# Patient Record
Sex: Male | Born: 1950 | Race: White | Hispanic: No | Marital: Married | Smoking: Current every day smoker
Health system: Southern US, Community
[De-identification: ages and names within clinical notes are randomized; demographics above are authoritative.]

## PROBLEM LIST (undated history)

## (undated) DIAGNOSIS — R079 Chest pain, unspecified: Secondary | ICD-10-CM

## (undated) HISTORY — DX: Chest pain, unspecified: R07.9

## (undated) HISTORY — PX: WRIST SURGERY: SHX841

## (undated) HISTORY — PX: CERVICAL SPINE SURGERY: SHX589

## (undated) HISTORY — PX: KNEE SURGERY: SHX244

---

## 2018-03-14 ENCOUNTER — Emergency Department (HOSPITAL_COMMUNITY): Payer: Medicare Other

## 2018-03-14 ENCOUNTER — Other Ambulatory Visit: Payer: Self-pay

## 2018-03-14 ENCOUNTER — Encounter (HOSPITAL_COMMUNITY): Payer: Self-pay | Admitting: Emergency Medicine

## 2018-03-14 DIAGNOSIS — R079 Chest pain, unspecified: Secondary | ICD-10-CM | POA: Diagnosis present

## 2018-03-14 DIAGNOSIS — F17210 Nicotine dependence, cigarettes, uncomplicated: Secondary | ICD-10-CM | POA: Diagnosis not present

## 2018-03-14 DIAGNOSIS — R072 Precordial pain: Secondary | ICD-10-CM | POA: Diagnosis not present

## 2018-03-14 LAB — CBC
HEMATOCRIT: 46.6 % (ref 39.0–52.0)
Hemoglobin: 16 g/dL (ref 13.0–17.0)
MCH: 29.1 pg (ref 26.0–34.0)
MCHC: 34.3 g/dL (ref 30.0–36.0)
MCV: 84.9 fL (ref 78.0–100.0)
PLATELETS: 238 10*3/uL (ref 150–400)
RBC: 5.49 MIL/uL (ref 4.22–5.81)
RDW: 13 % (ref 11.5–15.5)
WBC: 14.2 10*3/uL — AB (ref 4.0–10.5)

## 2018-03-14 LAB — BASIC METABOLIC PANEL
Anion gap: 12 (ref 5–15)
BUN: 12 mg/dL (ref 6–20)
CHLORIDE: 100 mmol/L — AB (ref 101–111)
CO2: 25 mmol/L (ref 22–32)
Calcium: 9.5 mg/dL (ref 8.9–10.3)
Creatinine, Ser: 1.11 mg/dL (ref 0.61–1.24)
GFR calc non Af Amer: >60 mL/min (ref >60)
Glucose, Bld: 84 mg/dL (ref 65–99)
POTASSIUM: 4.2 mmol/L (ref 3.5–5.1)
SODIUM: 137 mmol/L (ref 135–145)

## 2018-03-14 LAB — I-STAT TROPONIN, ED: Troponin i, poc: 0 ng/mL (ref 0.00–0.08)

## 2018-03-14 NOTE — ED Notes (Signed)
Updated on wait for treatment room.  Remains in ED waiting room.

## 2018-03-14 NOTE — ED Triage Notes (Signed)
C/o intermittent dullness to L chest with sob x 5 days.  Denies nausea and vomiting.  No pain at present.

## 2018-03-15 ENCOUNTER — Emergency Department (HOSPITAL_COMMUNITY)
Admission: EM | Admit: 2018-03-15 | Discharge: 2018-03-15 | Disposition: A | Discharge status: Home / Self Care | Payer: Medicare Other | Attending: Emergency Medicine | Admitting: Emergency Medicine

## 2018-03-15 ENCOUNTER — Encounter (HOSPITAL_COMMUNITY): Payer: Self-pay | Admitting: Emergency Medicine

## 2018-03-15 DIAGNOSIS — R072 Precordial pain: Secondary | ICD-10-CM

## 2018-03-15 LAB — I-STAT TROPONIN, ED: Troponin i, poc: 0 ng/mL (ref 0.00–0.08)

## 2018-03-15 MED ORDER — GI COCKTAIL ~~LOC~~
30.0000 mL | Freq: Once | ORAL | Status: AC
  Administered 2018-03-15: 30 mL via ORAL
  Filled 2018-03-15: qty 30

## 2018-03-15 NOTE — ED Provider Notes (Signed)
[facility] EMERGENCY DEPARTMENT Provider Note   CSN: [id number] Arrival date & time: 03/14/18  1655     History   Chief Complaint Chief Complaint  Patient presents with  . Chest Pain    HPI Andrew Tapia is a 67 y.o. male.  The history is provided by the patient. No language interpreter was used.  Chest Pain   This is a new problem. The current episode started more than 2 days ago. The problem occurs daily. The problem has not changed since onset.The pain is associated with rest (No DOE or exertional symptoms). The pain is present in the substernal region. The pain is mild. The quality of the pain is described as brief (skipping sensation). The pain does not radiate. Exacerbated by: no exertional symptoms, he gets better with activity. Pertinent negatives include no abdominal pain, no back pain, no claudication, no cough, no diaphoresis, no dizziness, no exertional chest pressure, no fever, no headaches, no hemoptysis, no irregular heartbeat, no leg pain, no lower extremity edema, no malaise/fatigue, no nausea, no near-syncope, no numbness, no orthopnea, no palpitations, no PND, no shortness of breath, no sputum production, no syncope, no vomiting and no weakness. He has tried rest for the symptoms. The treatment provided no relief. Risk factors include male gender.  Pertinent negatives for past medical history include no MI and no PE.  Pertinent negatives for family medical history include: no Marfan's syndrome.  Procedure history is negative for cardiac catheterization.    History reviewed. No pertinent past medical history.  There are no active problems to display for this patient.   Past Surgical History:  Procedure Laterality Date  . CERVICAL SPINE SURGERY    . KNEE SURGERY    . WRIST SURGERY          Home Medications    Prior to Admission medications   Not on File    Family History No family history on file.  Social History Social History    Tobacco Use  . Smoking status: Current Every Day Smoker  . Smokeless tobacco: Never Used  Substance Use Topics  . Alcohol use: Not Currently  . Drug use: Never     Allergies   Patient has no allergy information on record.   Review of Systems Review of Systems  Constitutional: Negative for diaphoresis, fever and malaise/fatigue.  Respiratory: Negative for cough, hemoptysis, sputum production and shortness of breath.   Cardiovascular: Positive for chest pain. Negative for palpitations, orthopnea, claudication, leg swelling, syncope, PND and near-syncope.  Gastrointestinal: Negative for abdominal pain, nausea and vomiting.  Musculoskeletal: Negative for back pain.  Neurological: Negative for dizziness, weakness, numbness and headaches.  All other systems reviewed and are negative.    Physical Exam Updated Vital Signs BP (!) 148/100 (BP Location: Left Arm)   Pulse 77   Temp 98.6 F (37 C) (Oral)   Resp 16   Ht 6' (1.829 m)   Wt 117.9 kg (260 lb)   SpO2 100%   BMI 35.26 kg/m   Physical Exam  Constitutional: He is oriented to person, place, and time. He appears well-developed and well-nourished. No distress.  HENT:  Head: Normocephalic and atraumatic.  Mouth/Throat: No oropharyngeal exudate.  Eyes: Pupils are equal, round, and reactive to light. Conjunctivae are normal.  Neck: Normal range of motion. Neck supple.  Cardiovascular: Normal rate, regular rhythm, normal heart sounds and intact distal pulses.  Pulmonary/Chest: Effort normal and breath sounds normal. No stridor. He has no wheezes.  He has no rales.  Abdominal: Soft. Bowel sounds are normal. He exhibits no mass. There is no tenderness. There is no rebound and no guarding.  Musculoskeletal: Normal range of motion.  Neurological: He is alert and oriented to person, place, and time. He displays normal reflexes.  Skin: Skin is warm and dry. Capillary refill takes less than 2 seconds.  Psychiatric: He has a normal  mood and affect.     ED Treatments / Results  Labs (all labs ordered are listed, but only abnormal results are displayed)  Results for orders placed or performed during the hospital encounter of 03/15/18  Basic metabolic panel  Result Value Ref Range   Sodium 137 135 - 145 mmol/L   Potassium 4.2 3.5 - 5.1 mmol/L   Chloride 100 (L) 101 - 111 mmol/L   CO2 25 22 - 32 mmol/L   Glucose, Bld 84 65 - 99 mg/dL   BUN 12 6 - 20 mg/dL   Creatinine, Ser 4.541.11 0.61 - 1.24 mg/dL   Calcium 9.5 8.9 - 09.810.3 mg/dL   GFR calc non Af Amer >60 >60 mL/min   GFR calc Af Amer >60 >60 mL/min   Anion gap 12 5 - 15  CBC  Result Value Ref Range   WBC 14.2 (H) 4.0 - 10.5 K/uL   RBC 5.49 4.22 - 5.81 MIL/uL   Hemoglobin 16.0 13.0 - 17.0 g/dL   HCT 11.946.6 14.739.0 - 82.952.0 %   MCV 84.9 78.0 - 100.0 fL   MCH 29.1 26.0 - 34.0 pg   MCHC 34.3 30.0 - 36.0 g/dL   RDW 56.213.0 13.011.5 - 86.515.5 %   Platelets 238 150 - 400 K/uL  I-stat troponin, ED  Result Value Ref Range   Troponin i, poc 0.00 0.00 - 0.08 ng/mL   Comment 3          I-stat troponin, ED  Result Value Ref Range   Troponin i, poc 0.00 0.00 - 0.08 ng/mL   Comment 3           Dg Chest 2 View  Result Date: 03/14/2018 CLINICAL DATA:  Chest pain EXAM: CHEST - 2 VIEW COMPARISON:  None. FINDINGS: The heart size and mediastinal contours are within normal limits. Both lungs are clear. Mild degenerative changes of the spine. IMPRESSION: No active cardiopulmonary disease. Electronically Signed   By: Jasmine PangKim  Fujinaga M.D.   On: 03/14/2018 18:30   ' EKG EKG Interpretation  Date/Time:  Wednesday March 14 2018 17:04:42 EDT Ventricular Rate:  84 PR Interval:  160 QRS Duration: 84 QT Interval:  356 QTC Calculation: 420 R Axis:   80 Text Interpretation:  Normal sinus rhythm Confirmed by Nicanor AlconPalumbo, Jaray Boliver ([id number]) on 03/15/2018 12:55:34 AM   Radiology Dg Chest 2 View  Result Date: 03/14/2018 CLINICAL DATA:  Chest pain EXAM: CHEST - 2 VIEW COMPARISON:  None. FINDINGS: The  heart size and mediastinal contours are within normal limits. Both lungs are clear. Mild degenerative changes of the spine. IMPRESSION: No active cardiopulmonary disease. Electronically Signed   By: Jasmine PangKim  Fujinaga M.D.   On: 03/14/2018 18:30    Procedures Procedures (including critical care time)  Medications Ordered in ED Medications  gi cocktail (Maalox,Lidocaine,Donnatal) (30 mLs Oral Given 03/15/18 0202)     Final Clinical Impressions(s) / ED Diagnoses   No exertional symptoms.  Ruled out for MI.  No leg pain or swelling no surgeries or car trips/plane trips.  Will refer to cardiology for holter monitor and follow up.  Heart score is 1 low risk for MACE.  Return for weakness, numbness, changes in vision or speech, fevers >100.4 unrelieved by medication, shortness of breath, intractable vomiting, or diarrhea, abdominal pain, Inability to tolerate liquids or food, cough, altered mental status or any concerns. No signs of systemic illness or infection. The patient is nontoxic-appearing on exam and vital signs are within normal limits.   I have reviewed the triage vital signs and the nursing notes. Pertinent labs &imaging results that were available during my care of the patient were reviewed by me and considered in my medical decision making (see chart for details).  After history, exam, and medical workup I feel the patient has been appropriately medically screened and is safe for discharge home. Pertinent diagnoses were discussed with the patient. Patient was given return precautions.  ED Discharge Orders    None       Ashaki Frosch, MD 03/15/18 1610

## 2018-03-30 ENCOUNTER — Encounter: Payer: Self-pay | Admitting: Cardiology

## 2018-04-09 ENCOUNTER — Ambulatory Visit: Payer: Medicare Other | Admitting: Cardiology

## 2018-04-10 ENCOUNTER — Encounter: Payer: Self-pay | Admitting: Cardiology

## 2018-04-10 ENCOUNTER — Ambulatory Visit: Payer: Medicare Other | Admitting: Cardiology

## 2018-04-10 VITALS — BP 116/90 | HR 65 | Ht 72.0 in | Wt 257.0 lb

## 2018-04-10 DIAGNOSIS — Z720 Tobacco use: Secondary | ICD-10-CM | POA: Diagnosis not present

## 2018-04-10 DIAGNOSIS — R072 Precordial pain: Secondary | ICD-10-CM

## 2018-04-10 DIAGNOSIS — R0602 Shortness of breath: Secondary | ICD-10-CM | POA: Diagnosis not present

## 2018-04-10 DIAGNOSIS — E785 Hyperlipidemia, unspecified: Secondary | ICD-10-CM | POA: Diagnosis not present

## 2018-04-10 DIAGNOSIS — R079 Chest pain, unspecified: Secondary | ICD-10-CM | POA: Insufficient documentation

## 2018-04-10 DIAGNOSIS — Z8249 Family history of ischemic heart disease and other diseases of the circulatory system: Secondary | ICD-10-CM | POA: Diagnosis not present

## 2018-04-10 MED ORDER — PREDNISONE 50 MG PO TABS: 50.0000 mg | ORAL_TABLET | Freq: Every day | ORAL | 0 refills | Status: DC

## 2018-04-10 MED ORDER — METOPROLOL TARTRATE 50 MG PO TABS: 50.0000 mg | ORAL_TABLET | Freq: Once | ORAL | 0 refills | Status: DC

## 2018-04-10 MED ORDER — DIPHENHYDRAMINE HCL 50 MG PO TABS: 50.0000 mg | ORAL_TABLET | Freq: Once | ORAL | 0 refills | Status: DC

## 2018-04-10 NOTE — Patient Instructions (Addendum)
Medication Instructions: No mediations were started today  Labwork: FUTURE: BMET for CT   Procedures/Testing: Please schedule Coronary CTA, see instructions below   Follow-Up: Your physician recommends that you schedule a follow-up appointment in: See Lizabeth LeydenNina Hammond NP 1 week after CT   Any Additional Special Instructions Will Be Listed Below (If Applicable).   DASH Eating Plan DASH stands for "Dietary Approaches to Stop Hypertension." The DASH eating plan is a healthy eating plan that has been shown to reduce high blood pressure (hypertension). It may also reduce your risk for type 2 diabetes, heart disease, and stroke. The DASH eating plan may also help with weight loss. What are tips for following this plan? General guidelines  Avoid eating more than 2,300 mg (milligrams) of salt (sodium) a day. If you have hypertension, you may need to reduce your sodium intake to 1,500 mg a day.  Limit alcohol intake to no more than 1 drink a day for nonpregnant women and 2 drinks a day for men. One drink equals 12 oz of beer, 5 oz of wine, or 1 oz of hard liquor.  Work with your health care provider to maintain a healthy body weight or to lose weight. Ask what an ideal weight is for you.  Get at least 30 minutes of exercise that causes your heart to beat faster (aerobic exercise) most days of the week. Activities may include walking, swimming, or biking.  Work with your health care provider or diet and nutrition specialist (dietitian) to adjust your eating plan to your individual calorie needs. Reading food labels  Check food labels for the amount of sodium per serving. Choose foods with less than 5 percent of the Daily Value of sodium. Generally, foods with less than 300 mg of sodium per serving fit into this eating plan.  To find whole grains, look for the word "whole" as the first word in the ingredient list. Shopping  Buy products labeled as "low-sodium" or "no salt added."  Buy fresh  foods. Avoid canned foods and premade or frozen meals. Cooking  Avoid adding salt when cooking. Use salt-free seasonings or herbs instead of table salt or sea salt. Check with your health care provider or pharmacist before using salt substitutes.  Do not fry foods. Cook foods using healthy methods such as baking, boiling, grilling, and broiling instead.  Cook with heart-healthy oils, such as olive, canola, soybean, or sunflower oil. Meal planning   Eat a balanced diet that includes: ? 5 or more servings of fruits and vegetables each day. At each meal, try to fill half of your plate with fruits and vegetables. ? Up to 6-8 servings of whole grains each day. ? Less than 6 oz of lean meat, poultry, or fish each day. A 3-oz serving of meat is about the same size as a deck of cards. One egg equals 1 oz. ? 2 servings of low-fat dairy each day. ? A serving of nuts, seeds, or beans 5 times each week. ? Heart-healthy fats. Healthy fats called Omega-3 fatty acids are found in foods such as flaxseeds and coldwater fish, like sardines, salmon, and mackerel.  Limit how much you eat of the following: ? Canned or prepackaged foods. ? Food that is high in trans fat, such as fried foods. ? Food that is high in saturated fat, such as fatty meat. ? Sweets, desserts, sugary drinks, and other foods with added sugar. ? Full-fat dairy products.  Do not salt foods before eating.  Try to eat  at least 2 vegetarian meals each week.  Eat more home-cooked food and less restaurant, buffet, and fast food.  When eating at a restaurant, ask that your food be prepared with less salt or no salt, if possible. What foods are recommended? The items listed may not be a complete list. Talk with your dietitian about what dietary choices are best for you. Grains Whole-grain or whole-wheat bread. Whole-grain or whole-wheat pasta. Brown rice. Orpah Cobbatmeal. Quinoa. Bulgur. Whole-grain and low-sodium cereals. Pita bread. Low-fat,  low-sodium crackers. Whole-wheat flour tortillas. Vegetables Fresh or frozen vegetables (raw, steamed, roasted, or grilled). Low-sodium or reduced-sodium tomato and vegetable juice. Low-sodium or reduced-sodium tomato sauce and tomato paste. Low-sodium or reduced-sodium canned vegetables. Fruits All fresh, dried, or frozen fruit. Canned fruit in natural juice (without added sugar). Meat and other protein foods Skinless chicken or Malawiturkey. Ground chicken or Malawiturkey. Pork with fat trimmed off. Fish and seafood. Egg whites. Dried beans, peas, or lentils. Unsalted nuts, nut butters, and seeds. Unsalted canned beans. Lean cuts of beef with fat trimmed off. Low-sodium, lean deli meat. Dairy Low-fat (1%) or fat-free (skim) milk. Fat-free, low-fat, or reduced-fat cheeses. Nonfat, low-sodium ricotta or cottage cheese. Low-fat or nonfat yogurt. Low-fat, low-sodium cheese. Fats and oils Soft margarine without trans fats. Vegetable oil. Low-fat, reduced-fat, or light mayonnaise and salad dressings (reduced-sodium). Canola, safflower, olive, soybean, and sunflower oils. Avocado. Seasoning and other foods Herbs. Spices. Seasoning mixes without salt. Unsalted popcorn and pretzels. Fat-free sweets. What foods are not recommended? The items listed may not be a complete list. Talk with your dietitian about what dietary choices are best for you. Grains Baked goods made with fat, such as croissants, muffins, or some breads. Dry pasta or rice meal packs. Vegetables Creamed or fried vegetables. Vegetables in a cheese sauce. Regular canned vegetables (not low-sodium or reduced-sodium). Regular canned tomato sauce and paste (not low-sodium or reduced-sodium). Regular tomato and vegetable juice (not low-sodium or reduced-sodium). Rosita FirePickles. Olives. Fruits Canned fruit in a light or heavy syrup. Fried fruit. Fruit in cream or butter sauce. Meat and other protein foods Fatty cuts of meat. Ribs. Fried meat. Tomasa BlaseBacon. Sausage.  Bologna and other processed lunch meats. Salami. Fatback. Hotdogs. Bratwurst. Salted nuts and seeds. Canned beans with added salt. Canned or smoked fish. Whole eggs or egg yolks. Chicken or Malawiturkey with skin. Dairy Whole or 2% milk, cream, and half-and-half. Whole or full-fat cream cheese. Whole-fat or sweetened yogurt. Full-fat cheese. Nondairy creamers. Whipped toppings. Processed cheese and cheese spreads. Fats and oils Butter. Stick margarine. Lard. Shortening. Ghee. Bacon fat. Tropical oils, such as coconut, palm kernel, or palm oil. Seasoning and other foods Salted popcorn and pretzels. Onion salt, garlic salt, seasoned salt, table salt, and sea salt. Worcestershire sauce. Tartar sauce. Barbecue sauce. Teriyaki sauce. Soy sauce, including reduced-sodium. Steak sauce. Canned and packaged gravies. Fish sauce. Oyster sauce. Cocktail sauce. Horseradish that you find on the shelf. Ketchup. Mustard. Meat flavorings and tenderizers. Bouillon cubes. Hot sauce and Tabasco sauce. Premade or packaged marinades. Premade or packaged taco seasonings. Relishes. Regular salad dressings. Where to find more information:  National Heart, Lung, and Blood Institute: PopSteam.iswww.nhlbi.nih.gov  American Heart Association: www.heart.org Summary  The DASH eating plan is a healthy eating plan that has been shown to reduce high blood pressure (hypertension). It may also reduce your risk for type 2 diabetes, heart disease, and stroke.  With the DASH eating plan, you should limit salt (sodium) intake to 2,300 mg a day. If you have hypertension,  you may need to reduce your sodium intake to 1,500 mg a day.  When on the DASH eating plan, aim to eat more fresh fruits and vegetables, whole grains, lean proteins, low-fat dairy, and heart-healthy fats.  Work with your health care provider or diet and nutrition specialist (dietitian) to adjust your eating plan to your individual calorie needs. This information is not intended to  replace advice given to you by your health care provider. Make sure you discuss any questions you have with your health care provider. Document Released: 11/24/2011 Document Revised: 11/28/2016 Document Reviewed: 11/28/2016 Elsevier Interactive Patient Education  [employer].        If you need a refill on your cardiac medications before your next appointment, please call your pharmacy.     Please arrive at the [facility] main entrance of [facility] at xx:xx AM (30-45 minutes prior to test start time)  [facility] [address] [place], [place] [address] [phone]  Proceed to the [facility] Radiology Department (First Floor).  Please follow these instructions carefully (unless otherwise directed):  Hold all erectile dysfunction medications at least 48 hours prior to test.  On the Night Before the Test: . Drink plenty of water. . Do not consume any caffeinated/decaffeinated beverages or chocolate 12 hours prior to your test. . Do not take any antihistamines 12 hours prior to your test. . If you take Metformin do not take 24 hours prior to test. . If the patient has contrast allergy: ? Patient will need a prescription for Prednisone and very clear instructions (as follows): 1. Prednisone 50 mg - take 13 hours prior to test 2. Take another Prednisone 50 mg 7 hours prior to test 3. Take another Prednisone 50 mg 1 hour prior to test 4. Take Benadryl 50 mg 1 hour prior to test . Patient must complete all four doses of above prophylactic medications. . Patient will need a ride after test due to Benadryl.  On the Day of the Test: . Drink plenty of water. Do not drink any water within one hour of the test. . Do not eat any food 4 hours prior to the test. . You may take your regular medications prior to the test. . IF NOT ON A BETA BLOCKER - Take 50 mg of lopressor (metoprolol) one hour before the test. . HOLD Furosemide morning of the  test.  After the Test: . Drink plenty of water. . After receiving IV contrast, you may experience a mild flushed feeling. This is normal. . On occasion, you may experience a mild rash up to 24 hours after the test. This is not dangerous. If this occurs, you can take Benadryl 25 mg and increase your fluid intake. . If you experience trouble breathing, this can be serious. If it is severe call 911 IMMEDIATELY. If it is mild, please call our office. . If you take any of these medications: Glipizide/Metformin, Avandament, Glucavance, please do not take 48 hours after completing test.  Steps to Quit Smoking Smoking tobacco can be bad for your health. It can also affect almost every organ in your body. Smoking puts you and people around you at risk for many serious long-lasting (chronic) diseases. Quitting smoking is hard, but it is one of the best things that you can do for your health. It is never too late to quit. What are the benefits of quitting smoking? When you quit smoking, you lower your risk for getting serious diseases and  conditions. They can include:  Lung cancer or lung disease.  Heart disease.  Stroke.  Heart attack.  Not being able to have children (infertility).  Weak bones (osteoporosis) and broken bones (fractures).  If you have coughing, wheezing, and shortness of breath, those symptoms may get better when you quit. You may also get sick less often. If you are pregnant, quitting smoking can help to lower your chances of having a baby of low birth weight. What can I do to help me quit smoking? Talk with your doctor about what can help you quit smoking. Some things you can do (strategies) include:  Quitting smoking totally, instead of slowly cutting back how much you smoke over a period of time.  Going to in-person counseling. You are more likely to quit if you go to many counseling sessions.  Using resources and support systems, such as: ? Agricultural engineer with a  Veterinary surgeon. ? Phone quitlines. ? Automotive engineer. ? Support groups or group counseling. ? Text messaging programs. ? Mobile phone apps or applications.  Taking medicines. Some of these medicines may have nicotine in them. If you are pregnant or breastfeeding, do not take any medicines to quit smoking unless your doctor says it is okay. Talk with your doctor about counseling or other things that can help you.  Talk with your doctor about using more than one strategy at the same time, such as taking medicines while you are also going to in-person counseling. This can help make quitting easier. What things can I do to make it easier to quit? Quitting smoking might feel very hard at first, but there is a lot that you can do to make it easier. Take these steps:  Talk to your family and friends. Ask them to support and encourage you.  Call phone quitlines, reach out to support groups, or work with a Veterinary surgeon.  Ask people who smoke to not smoke around you.  Avoid places that make you want (trigger) to smoke, such as: ? Bars. ? Parties. ? Smoke-break areas at work.  Spend time with people who do not smoke.  Lower the stress in your life. Stress can make you want to smoke. Try these things to help your stress: ? Getting regular exercise. ? Deep-breathing exercises. ? Yoga. ? Meditating. ? Doing a body scan. To do this, close your eyes, focus on one area of your body at a time from head to toe, and notice which parts of your body are tense. Try to relax the muscles in those areas.  Download or buy apps on your mobile phone or tablet that can help you stick to your quit plan. There are many free apps, such as QuitGuide from the [employer] developer for Disease Control and Prevention). You can find more support from smokefree.gov and other websites.  This information is not intended to replace advice given to you by your health care provider. Make sure you discuss any questions you have  with your health care provider. Document Released: 10/01/2009 Document Revised: 08/02/2016 Document Reviewed: 04/21/2015 Elsevier Interactive Patient Education  2018 [employer].
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
## 2018-07-17 ENCOUNTER — Ambulatory Visit (HOSPITAL_COMMUNITY): Admission: RE | Admit: 2018-07-17 | Payer: Medicare Other | Source: Ambulatory Visit

## 2018-07-17 ENCOUNTER — Ambulatory Visit (HOSPITAL_COMMUNITY)
Admission: RE | Admit: 2018-07-17 | Discharge: 2018-07-17 | Disposition: A | Discharge status: Home / Self Care | Payer: Medicare Other | Source: Ambulatory Visit | Attending: Cardiology | Admitting: Cardiology

## 2018-07-17 DIAGNOSIS — R911 Solitary pulmonary nodule: Secondary | ICD-10-CM | POA: Insufficient documentation

## 2018-07-17 DIAGNOSIS — R072 Precordial pain: Secondary | ICD-10-CM | POA: Insufficient documentation

## 2018-07-17 DIAGNOSIS — I2510 Atherosclerotic heart disease of native coronary artery without angina pectoris: Secondary | ICD-10-CM

## 2018-07-17 LAB — POCT I-STAT CREATININE: CREATININE: 1 mg/dL (ref 0.61–1.24)

## 2018-07-17 MED ORDER — METOPROLOL TARTRATE 5 MG/5ML IV SOLN
INTRAVENOUS | Status: AC
  Filled 2018-07-17: qty 20

## 2018-07-17 MED ORDER — NITROGLYCERIN 0.4 MG SL SUBL
SUBLINGUAL_TABLET | SUBLINGUAL | Status: AC
  Filled 2018-07-17: qty 2

## 2018-07-17 MED ORDER — IOPAMIDOL (ISOVUE-370) INJECTION 76%
INTRAVENOUS | Status: AC
  Administered 2018-07-17: 160 mL
  Filled 2018-07-17: qty 100

## 2018-07-17 MED ORDER — NITROGLYCERIN 0.4 MG SL SUBL
0.8000 mg | SUBLINGUAL_TABLET | Freq: Once | SUBLINGUAL | Status: AC
  Administered 2018-07-17: 0.8 mg via SUBLINGUAL
  Filled 2018-07-17: qty 25

## 2018-07-17 MED ORDER — METOPROLOL TARTRATE 5 MG/5ML IV SOLN
5.0000 mg | INTRAVENOUS | Status: DC | PRN
  Administered 2018-07-17 (×4): 5 mg via INTRAVENOUS
  Filled 2018-07-17 (×5): qty 5

## 2018-07-17 NOTE — Progress Notes (Signed)
CT scan completed. Tolerated well. D/C home walking, awake and alert. In no distress. 

## 2018-07-18 ENCOUNTER — Telehealth: Payer: Self-pay

## 2018-07-18 NOTE — Telephone Encounter (Signed)
Error

## 2018-07-20 ENCOUNTER — Telehealth: Payer: Self-pay

## 2018-07-20 DIAGNOSIS — R911 Solitary pulmonary nodule: Secondary | ICD-10-CM

## 2018-07-20 NOTE — Telephone Encounter (Signed)
Orders in for CT. 

## 2018-08-22 ENCOUNTER — Ambulatory Visit: Payer: Medicare Other | Admitting: Cardiology

## 2018-08-22 ENCOUNTER — Encounter: Payer: Self-pay | Admitting: Cardiology

## 2018-08-22 DIAGNOSIS — E785 Hyperlipidemia, unspecified: Secondary | ICD-10-CM | POA: Diagnosis not present

## 2018-08-22 DIAGNOSIS — R931 Abnormal findings on diagnostic imaging of heart and coronary circulation: Secondary | ICD-10-CM | POA: Diagnosis not present

## 2018-08-22 DIAGNOSIS — R911 Solitary pulmonary nodule: Secondary | ICD-10-CM | POA: Diagnosis not present

## 2018-08-22 DIAGNOSIS — Z720 Tobacco use: Secondary | ICD-10-CM
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
## 2018-08-22 NOTE — Patient Instructions (Addendum)
Medication Instructions:  Your physician recommends that you continue on your current medications as directed. Please refer to the Current Medication list given to you today.   Labwork: None ordered  Testing/Procedures: Your physician recommends that you have a Non-Contrast Chest CT in July of 2020.  Follow-Up: Your physician wants you to follow-up in: 6-8 months with  Berton Bon, NP. You will receive a reminder letter in the mail two months in advance. If you don't receive a letter, please call our office to schedule the follow-up appointment.   Any Other Special Instructions Will Be Listed Below (If Applicable).     If you need a refill on your cardiac medications before your next appointment, please call your pharmacy.

## 2019-07-15 ENCOUNTER — Telehealth: Payer: Self-pay | Admitting: Cardiology

## 2019-07-15 NOTE — Telephone Encounter (Signed)
Patient states that he has a shellfish allergy. He wants to make sure that the procedure will not use the dye , as it can cause an allergic reaction. If the procedure will need to use contrast, he will need an antibiotic sent to his pharmacy to take beforehand.

## 2019-07-22 ENCOUNTER — Ambulatory Visit (INDEPENDENT_AMBULATORY_CARE_PROVIDER_SITE_OTHER)
Admission: RE | Admit: 2019-07-22 | Discharge: 2019-07-22 | Disposition: A | Discharge status: Home / Self Care | Payer: Medicare Other | Source: Ambulatory Visit | Attending: Cardiology | Admitting: Cardiology

## 2019-07-22 ENCOUNTER — Other Ambulatory Visit: Payer: Self-pay

## 2019-07-22 DIAGNOSIS — R911 Solitary pulmonary nodule: Secondary | ICD-10-CM

## 2019-07-23 ENCOUNTER — Telehealth: Payer: Self-pay | Admitting: Cardiology

## 2019-07-23 NOTE — Telephone Encounter (Signed)
Follow up:    Patient returning call back concering lab results.
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
## 2020-01-30 ENCOUNTER — Ambulatory Visit: Payer: Medicare Other | Admitting: Cardiology

## 2020-01-30 ENCOUNTER — Other Ambulatory Visit: Payer: Self-pay

## 2020-01-30 ENCOUNTER — Encounter: Payer: Self-pay | Admitting: Cardiology

## 2020-01-30 VITALS — BP 138/78 | HR 86 | Ht 72.0 in | Wt 265.0 lb

## 2020-01-30 DIAGNOSIS — R931 Abnormal findings on diagnostic imaging of heart and coronary circulation: Secondary | ICD-10-CM

## 2020-01-30 DIAGNOSIS — Z720 Tobacco use: Secondary | ICD-10-CM

## 2020-01-30 DIAGNOSIS — R911 Solitary pulmonary nodule: Secondary | ICD-10-CM

## 2020-01-30 DIAGNOSIS — E785 Hyperlipidemia, unspecified: Secondary | ICD-10-CM | POA: Diagnosis not present

## 2020-01-30 NOTE — Patient Instructions (Addendum)
Medication Instructions:  Your physician recommends that you continue on your current medications as directed. Please refer to the Current Medication list given to you today.  *If you need a refill on your cardiac medications before your next appointment, please call your pharmacy*  Lab Work: Fax your labs to [place] at [phone] Attn : Pecolia Ades, NP   If you have labs (blood work) drawn today and your tests are completely normal, you will receive your results only by: Marland Kitchen MyChart Message (if you have MyChart) OR . A paper copy in the mail If you have any lab test that is abnormal or we need to change your treatment, we will call you to review the results.  Testing/Procedures: None ordered   Follow-Up: At [facility], you and your health needs are our priority.  As part of our continuing mission to provide you with exceptional heart care, we have created designated Provider Care Teams.  These Care Teams include your primary Cardiologist (physician) and Advanced Practice Providers (APPs -  Physician Assistants and Nurse Practitioners) who all work together to provide you with the care you need, when you need it.  Your next appointment:   1 year(s)  The format for your next appointment:   In Person  Provider:   You may see  Mertie Moores, MD or one of the following Advanced Practice Providers on your designated Care Team:    Richardson Dopp, PA-C  [place], [place]  Daune Perch, NP   Other Instructions  Lifestyle Modifications to Prevent and Treat Heart Disease -Recommend heart healthy/Mediterranean diet, with whole grains, fruits, vegetables, fish, lean meats, nuts, olive oil and avocado oil.  -Limit salt intake to less than 2000 mg per day.  -Recommend moderate walking, starting slowly with a few minutes and working up to 3-5 times/week for 30-50 minutes each session. Aim for at least 150 minutes.week. Goal should be pace of 3 miles/hours, or walking 1.5 miles in 30  minutes -Recommend avoidance of tobacco products. Avoid excess alcohol. -Keep blood pressure well controlled, ideally less than 130/80.   ===========================  Mediterranean Diet A Mediterranean diet refers to food and lifestyle choices that are based on the traditions of countries located on the [employer]. This way of eating has been shown to help prevent certain conditions and improve outcomes for people who have chronic diseases, like kidney disease and heart disease. What are tips for following this plan? Lifestyle  Cook and eat meals together with your family, when possible.  Drink enough fluid to keep your urine clear or pale yellow.  Be physically active every day. This includes: ? Aerobic exercise like running or swimming. ? Leisure activities like gardening, walking, or housework.  Get 7-8 hours of sleep each night.  If recommended by your health care provider, drink red wine in moderation. This means 1 glass a day for nonpregnant women and 2 glasses a day for men. A glass of wine equals 5 oz (150 mL). Reading food labels   Check the serving size of packaged foods. For foods such as rice and pasta, the serving size refers to the amount of cooked product, not dry.  Check the total fat in packaged foods. Avoid foods that have saturated fat or trans fats.  Check the ingredients list for added sugars, such as corn syrup. Shopping  At the grocery store, buy most of your food from the areas near the walls of the store. This includes: ? Fresh fruits and vegetables (produce). ? Grains,  beans, nuts, and seeds. Some of these may be available in unpackaged forms or large amounts (in bulk). ? Fresh seafood. ? Poultry and eggs. ? Low-fat dairy products.  Buy whole ingredients instead of prepackaged foods.  Buy fresh fruits and vegetables in-season from local farmers markets.  Buy frozen fruits and vegetables in resealable bags.  If you do not have access to  quality fresh seafood, buy precooked frozen shrimp or canned fish, such as tuna, salmon, or sardines.  Buy small amounts of raw or cooked vegetables, salads, or olives from the deli or salad bar at your store.  Stock your pantry so you always have certain foods on hand, such as olive oil, canned tuna, canned tomatoes, rice, pasta, and beans. Cooking  Cook foods with extra-virgin olive oil instead of using butter or other vegetable oils.  Have meat as a side dish, and have vegetables or grains as your main dish. This means having meat in small portions or adding small amounts of meat to foods like pasta or stew.  Use beans or vegetables instead of meat in common dishes like chili or lasagna.  Experiment with different cooking methods. Try roasting or broiling vegetables instead of steaming or sauteing them.  Add frozen vegetables to soups, stews, pasta, or rice.  Add nuts or seeds for added healthy fat at each meal. You can add these to yogurt, salads, or vegetable dishes.  Marinate fish or vegetables using olive oil, lemon juice, garlic, and fresh herbs. Meal planning   Plan to eat 1 vegetarian meal one day each week. Try to work up to 2 vegetarian meals, if possible.  Eat seafood 2 or more times a week.  Have healthy snacks readily available, such as: ? Vegetable sticks with hummus. ? [place] yogurt. ? Fruit and nut trail mix.  Eat balanced meals throughout the week. This includes: ? Fruit: 2-3 servings a day ? Vegetables: 4-5 servings a day ? Low-fat dairy: 2 servings a day ? Fish, poultry, or lean meat: 1 serving a day ? Beans and legumes: 2 or more servings a week ? Nuts and seeds: 1-2 servings a day ? Whole grains: 6-8 servings a day ? Extra-virgin olive oil: 3-4 servings a day  Limit red meat and sweets to only a few servings a month What are my food choices?  Mediterranean diet ? Recommended  Grains: Whole-grain pasta. Brown rice. Bulgar wheat. Polenta. Couscous.  Whole-wheat bread. [place] Cobb.  Vegetables: Artichokes. Beets. Broccoli. Cabbage. Carrots. Eggplant. Green beans. Chard. Kale. Spinach. Onions. Leeks. Peas. Squash. Tomatoes. Peppers. Radishes.  Fruits: Apples. Apricots. Avocado. Berries. Bananas. Cherries. Dates. Figs. Grapes. Lemons. Melon. Oranges. Peaches. Plums. Pomegranate.  Meats and other protein foods: Beans. Almonds. Sunflower seeds. Pine nuts. Peanuts. [place]. Salmon. Scallops. Shrimp. Tuna. Tilapia. Clams. Oysters. Eggs.  Dairy: Low-fat milk. Cheese. Greek yogurt.  Beverages: Water. Red wine. Herbal tea.  Fats and oils: Extra virgin olive oil. Avocado oil. Grape seed oil.  Sweets and desserts: [place] yogurt with honey. Baked apples. Poached pears. Trail mix.  Seasoning and other foods: Basil. Cilantro. Coriander. Cumin. Mint. Parsley. Sage. Rosemary. Tarragon. Garlic. Oregano. Thyme. Pepper. Balsalmic vinegar. Tahini. Hummus. Tomato sauce. Olives. Mushrooms. ? Limit these  Grains: Prepackaged pasta or rice dishes. Prepackaged cereal with added sugar.  Vegetables: Deep fried potatoes (french fries).  Fruits: Fruit canned in syrup.  Meats and other protein foods: Beef. Pork. Lamb. Poultry with skin. Hot dogs. Tomasa Blase.  Dairy: Ice cream. Sour cream. Whole milk.  Beverages: Juice.  Sugar-sweetened soft drinks. Beer. Liquor and spirits.  Fats and oils: Butter. Canola oil. Vegetable oil. Beef fat (tallow). Lard.  Sweets and desserts: Cookies. Cakes. Pies. Candy.  Seasoning and other foods: Mayonnaise. Premade sauces and marinades. The items listed may not be a complete list. Talk with your dietitian about what dietary choices are right for you. Summary  The Mediterranean diet includes both food and lifestyle choices.  Eat a variety of fresh fruits and vegetables, beans, nuts, seeds, and whole grains.  Limit the amount of red meat and sweets that you eat.  Talk with your health care provider about whether it is safe  for you to drink red wine in moderation. This means 1 glass a day for nonpregnant women and 2 glasses a day for men. A glass of wine equals 5 oz (150 mL). This information is not intended to replace advice given to you by your health care provider. Make sure you discuss any questions you have with your health care provider. Document Revised: 08/04/2016 Document Reviewed: 07/28/2016 Elsevier Patient Education  2020 [employer].

## 2020-06-15 IMAGING — CT CT CHEST WITHOUT CONTRAST
2 of 3 series · 15 of 36 positions shown, 18 images · non-contrast
Comparison: 07/17/2018

CLINICAL DATA: Follow-up lung nodule

EXAM:
CT CHEST WITHOUT CONTRAST
TECHNIQUE: Multidetector CT imaging of the chest was performed following the
standard protocol without IV contrast.

[Series 2: thorax · axial · 0.79mm/px · z∈[-358,-58]mm · 12 of 178 slices shown, 15 images]
[im 14/178  mediastinal]
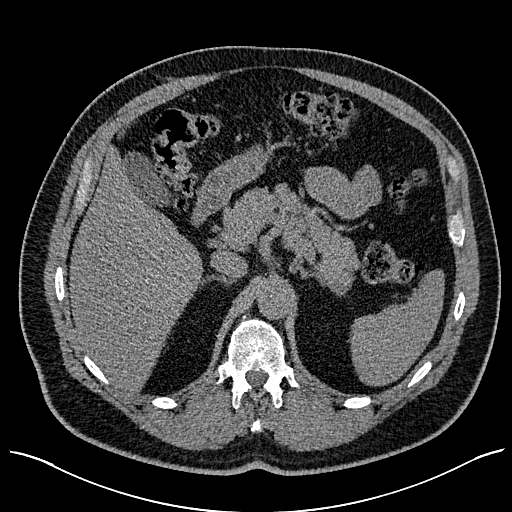
[im 14/178  lung]
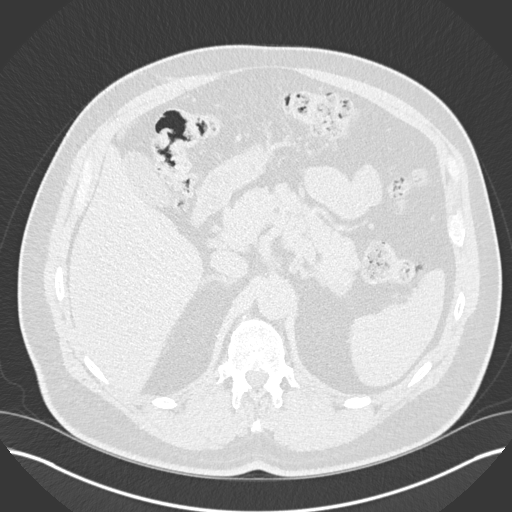
[im 27/178  lung]
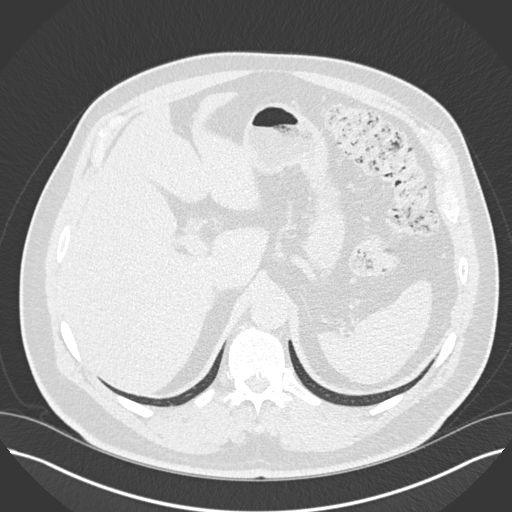
[im 40/178  lung]
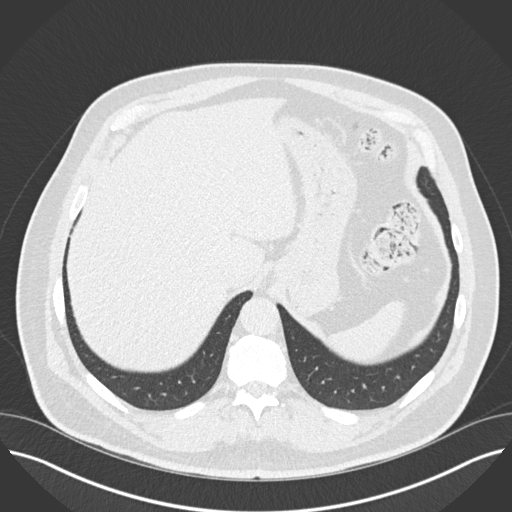
[im 53/178  lung]
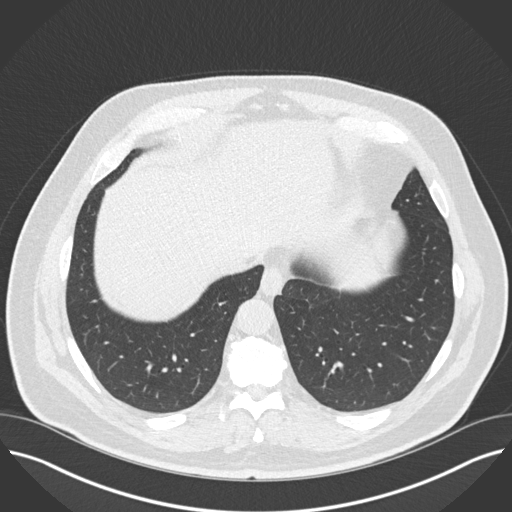
[im 66/178  mediastinal]
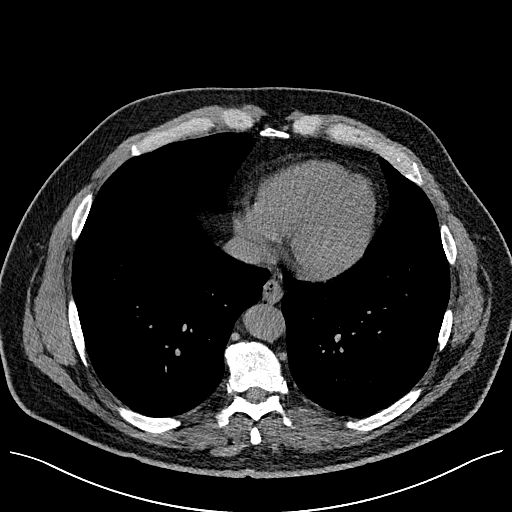
[im 66/178  lung]
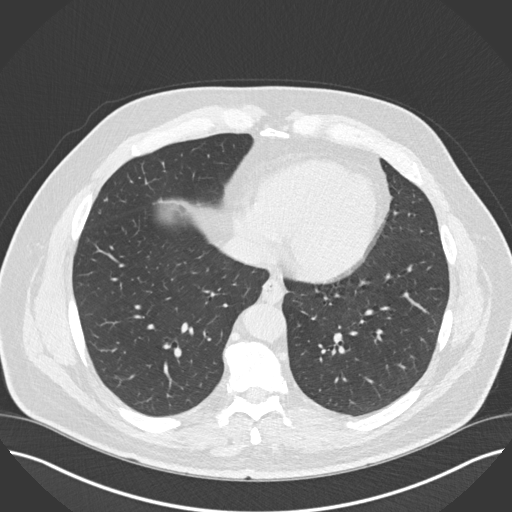
[im 79/178  lung]
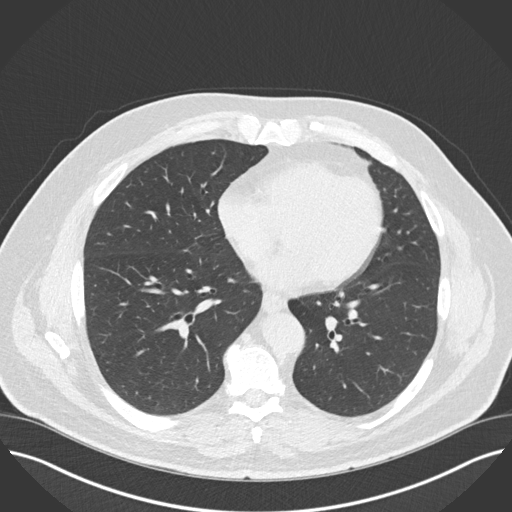
[im 99/178  lung]
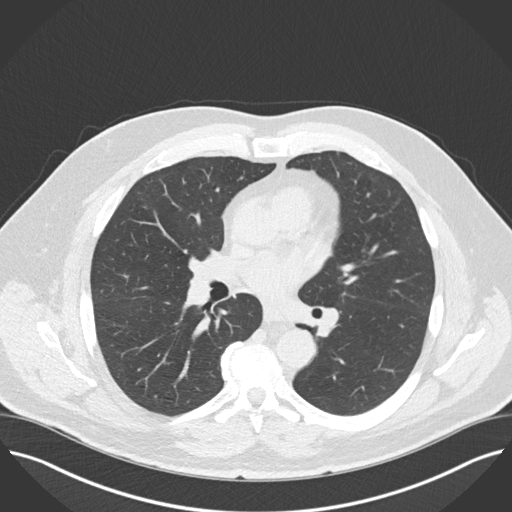
[im 112/178  lung]
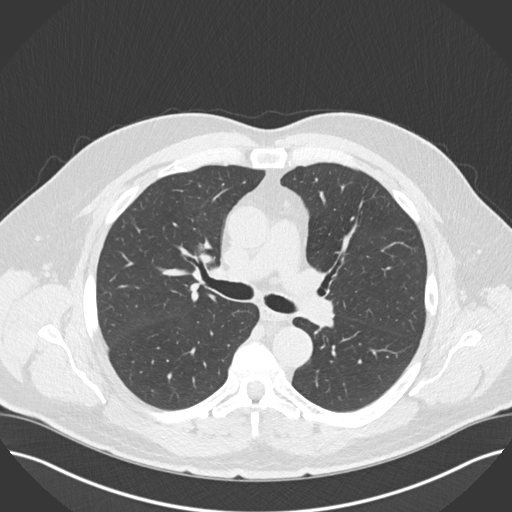
[im 125/178  mediastinal]
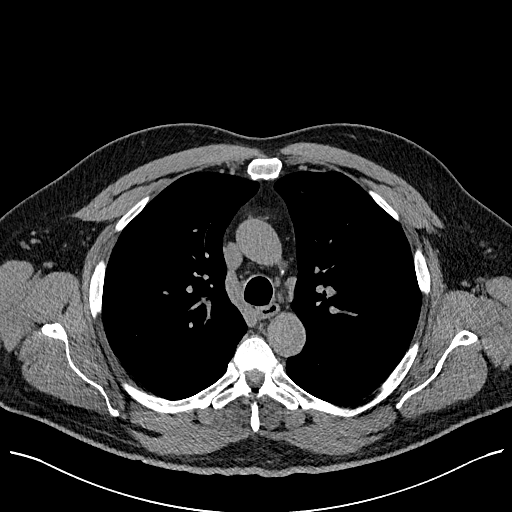
[im 125/178  lung]
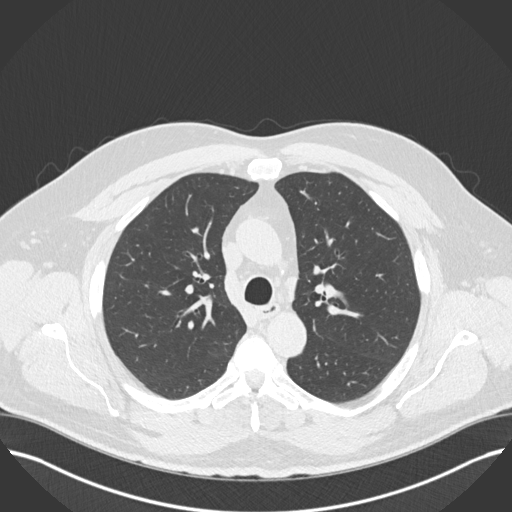
[im 138/178  lung]
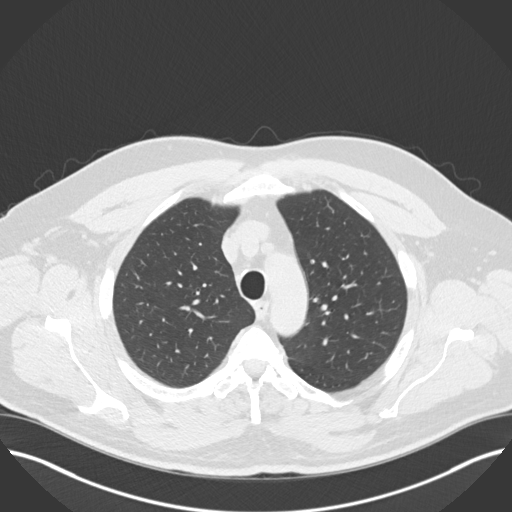
[im 151/178  lung]
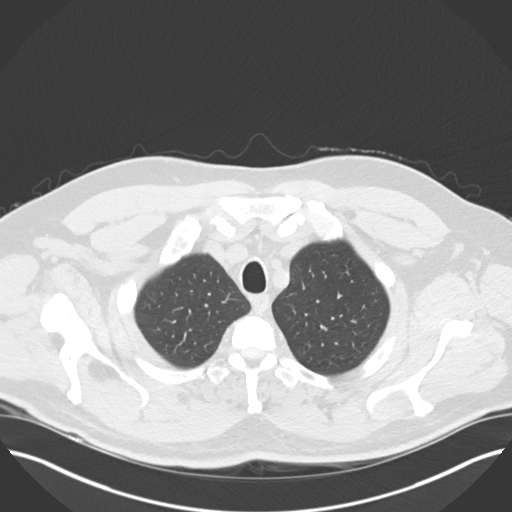
[im 164/178  lung]
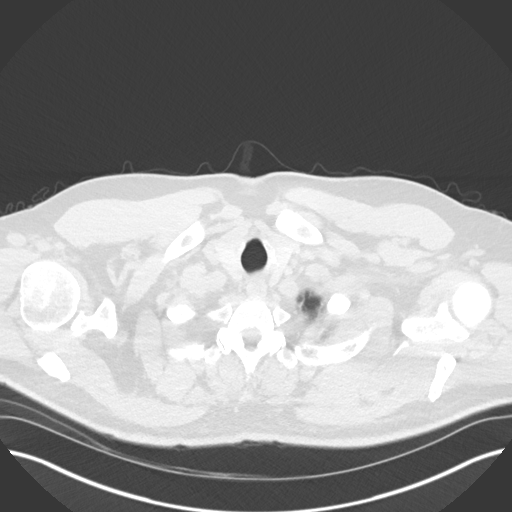

[Series 5: coronal · coronal · 0.70mm/px · 3 of 140 slices shown]
[im 28/140  lung]
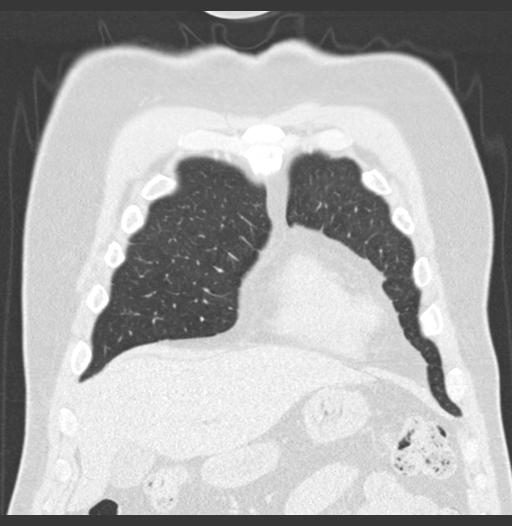
[im 56/140  lung]
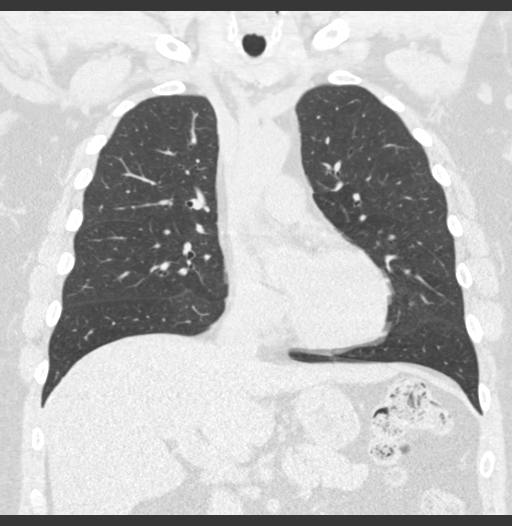
[im 84/140  lung]
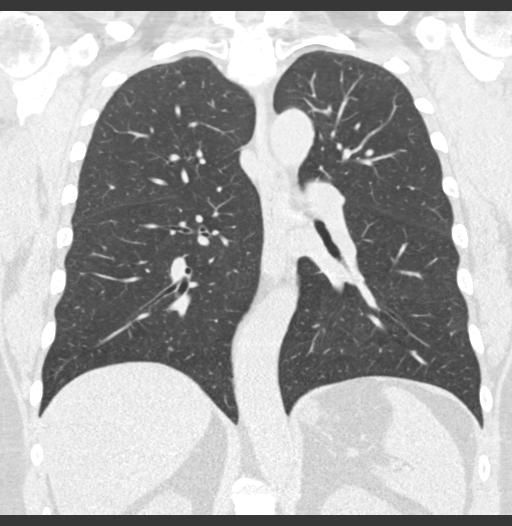

[15 of 36 positions shown; findings below may reference images not displayed]

FINDINGS: Cardiovascular: Minimal aortic atherosclerosis. Normal heart size.
Scattered three-vessel coronary artery calcifications. No
pericardial effusion.

Mediastinum/Nodes: No enlarged mediastinal, hilar, or axillary lymph
nodes. Thyroid gland, trachea, and esophagus demonstrate no
significant findings.

Lungs/Pleura: Stable, benign 3 mm pulmonary nodules of the right
middle lobe and lingula (series 3, image 90, 80, 78). No pleural
effusion or pneumothorax.

Upper Abdomen: No acute abnormality.

Musculoskeletal: No chest wall mass or suspicious bone lesions
identified.
IMPRESSION: 1. Stable, benign 3 mm pulmonary nodules of the right middle lobe
and lingula (series 3, image 90, 80, 78). No further routine CT
follow-up is required for these nodules.

2.  Coronary artery disease.
